# Patient Record
Sex: Male | Born: 1995 | Hispanic: Yes | Marital: Single | Smoking: Never smoker
Health system: Southern US, Community
[De-identification: ages and names within clinical notes are randomized; demographics above are authoritative.]

## PROBLEM LIST (undated history)

## (undated) DIAGNOSIS — G8929 Other chronic pain: Secondary | ICD-10-CM

## (undated) DIAGNOSIS — K5190 Ulcerative colitis, unspecified, without complications: Secondary | ICD-10-CM

## (undated) HISTORY — DX: Other chronic pain: G89.29

## (undated) HISTORY — DX: Ulcerative colitis, unspecified, without complications: K51.90

---

## 2021-01-10 ENCOUNTER — Emergency Department (HOSPITAL_COMMUNITY)
Admission: EM | Admit: 2021-01-10 | Discharge: 2021-01-11 | Disposition: A | Discharge status: Home / Self Care | Payer: 59 | Attending: Emergency Medicine | Admitting: Emergency Medicine

## 2021-01-10 ENCOUNTER — Encounter (HOSPITAL_COMMUNITY): Payer: Self-pay

## 2021-01-10 ENCOUNTER — Other Ambulatory Visit: Payer: Self-pay

## 2021-01-10 DIAGNOSIS — S76012A Strain of muscle, fascia and tendon of left hip, initial encounter: Secondary | ICD-10-CM | POA: Diagnosis not present

## 2021-01-10 DIAGNOSIS — R000 Tachycardia, unspecified: Secondary | ICD-10-CM | POA: Diagnosis not present

## 2021-01-10 DIAGNOSIS — K922 Gastrointestinal hemorrhage, unspecified: Secondary | ICD-10-CM

## 2021-01-10 DIAGNOSIS — R0789 Other chest pain: Secondary | ICD-10-CM | POA: Diagnosis not present

## 2021-01-10 DIAGNOSIS — S79912A Unspecified injury of left hip, initial encounter: Secondary | ICD-10-CM | POA: Diagnosis present

## 2021-01-10 DIAGNOSIS — M549 Dorsalgia, unspecified: Secondary | ICD-10-CM | POA: Diagnosis not present

## 2021-01-10 DIAGNOSIS — X58XXXA Exposure to other specified factors, initial encounter: Secondary | ICD-10-CM | POA: Diagnosis not present

## 2021-01-10 DIAGNOSIS — S76019A Strain of muscle, fascia and tendon of unspecified hip, initial encounter: Secondary | ICD-10-CM

## 2021-01-10 DIAGNOSIS — Z20822 Contact with and (suspected) exposure to covid-19: Secondary | ICD-10-CM | POA: Diagnosis not present

## 2021-01-10 LAB — COMPREHENSIVE METABOLIC PANEL
ALT: 25 U/L (ref 0–44)
AST: 17 U/L (ref 15–41)
Albumin: 3.2 g/dL — ABNORMAL LOW (ref 3.5–5.0)
Alkaline Phosphatase: 34 U/L — ABNORMAL LOW (ref 38–126)
Anion gap: 8 (ref 5–15)
BUN: 16 mg/dL (ref 6–20)
CO2: 26 mmol/L (ref 22–32)
Calcium: 8.7 mg/dL — ABNORMAL LOW (ref 8.9–10.3)
Chloride: 101 mmol/L (ref 98–111)
Creatinine, Ser: 1.06 mg/dL (ref 0.61–1.24)
GFR, Estimated: >60 mL/min (ref >60)
Glucose, Bld: 118 mg/dL — ABNORMAL HIGH (ref 70–99)
Potassium: 3.5 mmol/L (ref 3.5–5.1)
Sodium: 135 mmol/L (ref 135–145)
Total Bilirubin: 0.2 mg/dL — ABNORMAL LOW (ref 0.3–1.2)
Total Protein: 7.1 g/dL (ref 6.5–8.1)

## 2021-01-10 LAB — TYPE AND SCREEN
ABO/RH(D): AB POS
Antibody Screen: NEGATIVE

## 2021-01-10 LAB — CBC
HCT: 34.7 % — ABNORMAL LOW (ref 39.0–52.0)
Hemoglobin: 11.4 g/dL — ABNORMAL LOW (ref 13.0–17.0)
MCH: 27.4 pg (ref 26.0–34.0)
MCHC: 32.9 g/dL (ref 30.0–36.0)
MCV: 83.4 fL (ref 80.0–100.0)
Platelets: 324 10*3/uL (ref 150–400)
RBC: 4.16 MIL/uL — ABNORMAL LOW (ref 4.22–5.81)
RDW: 12.2 % (ref 11.5–15.5)
WBC: 12.3 10*3/uL — ABNORMAL HIGH (ref 4.0–10.5)
nRBC: 0 % (ref 0.0–0.2)

## 2021-01-10 NOTE — ED Provider Notes (Signed)
Emergency Medicine Provider Triage Evaluation Note  Johnny Duffy , a 25 y.o. male  was evaluated in triage.  Pt complains of back pain and bloody stools. Denies constipation.  Review of Systems  Positive: Back pain, bloody stools, abd pain Negative: constipation  Physical Exam  BP 138/89 (BP Location: Right Arm)   Pulse (!) 113   Temp 98.1 F (36.7 C) (Oral)   Resp 18   Ht 5\' 4"  (1.626 m)   Wt 63.5 kg   SpO2 99%   BMI 24.03 kg/m  Gen:   Awake, no distress   Resp:  Normal effort  MSK:   Moves extremities without difficulty   Medical Decision Making  Medically screening exam initiated at 1:39 PM.  Appropriate orders placed.  Johnny Duffy was informed that the remainder of the evaluation will be completed by another provider, this initial triage assessment does not replace that evaluation, and the importance of remaining in the ED until their evaluation is complete.     Mayra Neer 01/10/21 1340    01/12/21, MD 01/10/21 1534

## 2021-01-10 NOTE — ED Triage Notes (Signed)
Patient c/o left lower back pain x 2 weeks and the pain radiates into the left leg. Patient went to UC on 10/20 and received hydrocodone and a muscle relaxer, but states that it did not help. Patient states it just made him feel tired so he did not take. Patient states he did receive a shot of Toradol and that seemed to help more  Patient also reports intermittent rectal bleeding that varies between dark red and bright red blood. Patient states he has abdominal discomfort, but not pain.

## 2021-01-11 ENCOUNTER — Encounter (HOSPITAL_COMMUNITY): Payer: Self-pay

## 2021-01-11 ENCOUNTER — Emergency Department (HOSPITAL_COMMUNITY): Payer: 59

## 2021-01-11 LAB — RESP PANEL BY RT-PCR (FLU A&B, COVID) ARPGX2
Influenza A by PCR: NEGATIVE
Influenza B by PCR: NEGATIVE
SARS Coronavirus 2 by RT PCR: NEGATIVE

## 2021-01-11 MED ORDER — IOHEXOL 350 MG/ML SOLN
80.0000 mL | Freq: Once | INTRAVENOUS | Status: AC | PRN
  Administered 2021-01-11: 80 mL via INTRAVENOUS

## 2021-01-11 MED ORDER — ACETAMINOPHEN 325 MG PO TABS
650.0000 mg | ORAL_TABLET | Freq: Once | ORAL | Status: AC
  Administered 2021-01-11: 650 mg via ORAL
  Filled 2021-01-11: qty 2

## 2021-01-11 MED ORDER — AMOXICILLIN-POT CLAVULANATE 875-125 MG PO TABS
1.0000 | ORAL_TABLET | Freq: Two times a day (BID) | ORAL | 0 refills | Status: DC
Start: 2021-01-11 — End: 2021-01-11

## 2021-01-11 MED ORDER — AMOXICILLIN-POT CLAVULANATE 875-125 MG PO TABS: 1.0000 | ORAL_TABLET | Freq: Two times a day (BID) | ORAL | 0 refills | Status: AC

## 2021-01-11 MED ORDER — SODIUM CHLORIDE 0.9 % IV BOLUS
1000.0000 mL | Freq: Once | INTRAVENOUS | Status: AC
  Administered 2021-01-11: 1000 mL via INTRAVENOUS

## 2021-01-11 MED ORDER — AMOXICILLIN-POT CLAVULANATE 875-125 MG PO TABS
1.0000 | ORAL_TABLET | Freq: Once | ORAL | Status: AC
  Administered 2021-01-11: 1 via ORAL
  Filled 2021-01-11: qty 1

## 2021-01-11 NOTE — ED Notes (Signed)
Pt transported to CT ?

## 2021-01-11 NOTE — ED Notes (Signed)
Pt aware urine sample needed 

## 2021-01-11 NOTE — Discharge Instructions (Addendum)
You are seen in the ER for bloody stools.  Given that your symptoms of loose bowel movement have been present for a while, it is unclear what to make of the transition to bloody stools.  Fortunately, your blood work here including your hemoglobin is overall reassuring  Our recommendation is that you take the antibiotics only if you start developing fevers or abdominal pain.  Otherwise, it is prudent that you call the GI doctor for a close follow-up.  Return to the ER if you start having worsening bloody stools, weakness, dark tarry stools, bloody vomit.

## 2021-01-11 NOTE — ED Provider Notes (Signed)
[facility]-EMERGENCY DEPT Provider Note   CSN: [id number] Arrival date & time: 01/10/21  1247     History Chief Complaint  Patient presents with   Back Pain   GI Bleeding    Johnny Duffy is a 25 y.o. male.  HPI    25 year old comes in with chief complaint of back pain, bloody stools and chest pain. Patient has no significant medical history.  He reports that he started having loose bowel movements about a month and a half ago.  Normally he has 3 BMs a day.  In the last month he has been having about 4 BMs a day and they have been loose.  Today he had 2 loose bowel movements that were bloody.  He denies any associated abdominal pain.  Patient also denies any fevers, chills, nausea, vomiting.  Additionally patient reports that he is having some back pain.  The back pain is located in the left hip region.  He was seen at urgent care and was put on hydrocodone with a diagnosis of sciatica, but the pain medication has not helped.  He denies radiation of the pain to his foot and also denies any numbness or tingling.  Pain is worse with activities and when he turns onto that side.  Finally patient also reports having some chest pain.  Chest pain is worse with deep inspiration.  He had flown to [place] after driving to [place] in September.  History reviewed. No pertinent past medical history.  There are no problems to display for this patient.   History reviewed. No pertinent surgical history.     Family History  Problem Relation Age of Onset   Diabetes Father     Social History   Tobacco Use   Smoking status: Never   Smokeless tobacco: Never  Vaping Use   Vaping Use: Never used  Substance Use Topics   Alcohol use: Never   Drug use: Never    Home Medications Prior to Admission medications   Medication Sig Start Date End Date Taking? Authorizing Provider  cyclobenzaprine (FLEXERIL) 10 MG tablet Take 10 mg by mouth 3 (three) times daily as  needed for muscle spasms. 01/02/21  Yes [provider]  HYDROcodone-acetaminophen (NORCO/VICODIN) 5-325 MG tablet Take 1 tablet by mouth 4 (four) times daily as needed for pain. 01/07/21  Yes [provider]  Ibuprofen-Acetaminophen (ADVIL DUAL ACTION) 125-250 MG TABS Take 2 tablets by mouth every 8 (eight) hours as needed (pain).   Yes [provider]  amoxicillin-clavulanate (AUGMENTIN) 875-125 MG tablet Take 1 tablet by mouth every 12 (twelve) hours. 01/11/21   Derwood Kaplan, MD    Allergies    Patient has no known allergies.  Review of Systems   Review of Systems  Constitutional:  Positive for activity change.  Cardiovascular:  Positive for chest pain.  Gastrointestinal:  Positive for blood in stool.  All other systems reviewed and are negative.  Physical Exam Updated Vital Signs BP 138/85   Pulse 97   Temp 99.3 F (37.4 C) (Oral)   Resp (!) 22   Ht 5\' 4"  (1.626 m)   Wt 63.5 kg   SpO2 99%   BMI 24.03 kg/m   Physical Exam Vitals and nursing note reviewed.  Constitutional:      Appearance: He is well-developed.  HENT:     Head: Atraumatic.  Cardiovascular:     Rate and Rhythm: Tachycardia present.  Pulmonary:     Effort: Pulmonary effort is normal.  Abdominal:     Tenderness: There is no abdominal tenderness.  Musculoskeletal:     Cervical back: Neck supple.  Skin:    General: Skin is warm.  Neurological:     Mental Status: He is alert and oriented to person, place, and time.    ED Results / Procedures / Treatments   Labs (all labs ordered are listed, but only abnormal results are displayed) Labs Reviewed  COMPREHENSIVE METABOLIC PANEL - Abnormal; Notable for the following components:      Result Value   Glucose, Bld 118 (*)    Calcium 8.7 (*)    Albumin 3.2 (*)    Alkaline Phosphatase 34 (*)    Total Bilirubin 0.2 (*)    All other components within normal limits  CBC - Abnormal; Notable for the following components:   WBC  12.3 (*)    RBC 4.16 (*)    Hemoglobin 11.4 (*)    HCT 34.7 (*)    All other components within normal limits  RESP PANEL BY RT-PCR (FLU A&B, COVID) ARPGX2  URINALYSIS, ROUTINE W REFLEX MICROSCOPIC  POC OCCULT BLOOD, ED  TYPE AND SCREEN    EKG None  Radiology CT Angio Chest PE W and/or Wo Contrast  Result Date: 01/11/2021 CLINICAL DATA:  Pleuritic chest pain. Recent long duration flight. High suspicion for PE. EXAM: CT ANGIOGRAPHY CHEST WITH CONTRAST TECHNIQUE: Multidetector CT imaging of the chest was performed using the standard protocol during bolus administration of intravenous contrast. Multiplanar CT image reconstructions and MIPs were obtained to evaluate the vascular anatomy. CONTRAST:  1mL OMNIPAQUE IOHEXOL 350 MG/ML SOLN COMPARISON:  None. FINDINGS: Cardiovascular: Satisfactory opacification of the pulmonary arteries to the segmental level. No evidence of pulmonary embolism. Normal heart size. No pericardial effusion. Mediastinum/Nodes: No enlarged mediastinal, hilar, or axillary lymph nodes. Thyroid gland, trachea, and esophagus demonstrate no significant findings. Lungs/Pleura: Lungs are clear. No pleural effusion or pneumothorax. Upper Abdomen: No acute abnormality. Musculoskeletal: No chest wall abnormality. No acute or significant osseous findings. Review of the MIP images confirms the above findings. IMPRESSION: Negative for acute pulmonary embolus, pneumonia or other acute cardiopulmonary process. Electronically Signed   By: Malachy Moan M.D.   On: 01/11/2021 14:20    Procedures Procedures   Medications Ordered in ED Medications  acetaminophen (TYLENOL) tablet 650 mg (650 mg Oral Given 01/11/21 0303)  amoxicillin-clavulanate (AUGMENTIN) 875-125 MG per tablet 1 tablet (1 tablet Oral Given 01/11/21 1314)  sodium chloride 0.9 % bolus 1,000 mL (1,000 mLs Intravenous New Bag/Given (Non-Interop) 01/11/21 1340)  iohexol (OMNIPAQUE) 350 MG/ML injection 80 mL (80 mLs  Intravenous Contrast Given 01/11/21 1405)    ED Course  I have reviewed the triage vital signs and the nursing notes.  Pertinent labs & imaging results that were available during my care of the patient were reviewed by me and considered in my medical decision making (see chart for details).    MDM Rules/Calculators/A&P                           25 year old male comes in with multiple complaints.  Primarily he is here for bloody stools.  He has been having loose BM for the last several days, yesterday he had 2 episodes of bloody stools.  While in the triage he had low-grade fever.  No abdominal pain.  Recently he had traveled to [place], but states that the bloody stools actually started even prior to that trip.  He  reports about 10 pound weight loss in the last month and a half.  No family history of IBD, but that is in the consideration for him.  We will advised that he gets GI follow-up.  Given that he had low-grade fever, we have given him antibiotics as a wait and watch approach.  Additionally, patient is having some back pain.  Clinically does not appear to be sciatica.  There is no passive leg raise.  We will give him orthopedics follow-up as he has not improved despite the treatment over the past several days.  Finally, patient has pleuritic chest pain with recent long distance travels.  We did not order D-dimer on him because of the active bleeding, CT PE was ordered and it is negative for acute findings.  Stable for discharge at this time.   Final Clinical Impression(s) / ED Diagnoses Final diagnoses:  Lower GI bleed  Hip strain, unspecified laterality, initial encounter    Rx / DC Orders ED Discharge Orders          Ordered    amoxicillin-clavulanate (AUGMENTIN) 875-125 MG tablet  Every 12 hours,   Status:  Discontinued        01/11/21 1305    amoxicillin-clavulanate (AUGMENTIN) 875-125 MG tablet  Every 12 hours        01/11/21 1305             Derwood Kaplan,  MD 01/11/21 [phone]

## 2021-01-16 ENCOUNTER — Other Ambulatory Visit: Payer: Self-pay | Admitting: Gastroenterology

## 2021-01-16 DIAGNOSIS — K921 Melena: Secondary | ICD-10-CM

## 2021-01-16 DIAGNOSIS — R634 Abnormal weight loss: Secondary | ICD-10-CM

## 2021-01-16 DIAGNOSIS — R197 Diarrhea, unspecified: Secondary | ICD-10-CM

## 2021-01-16 DIAGNOSIS — R109 Unspecified abdominal pain: Secondary | ICD-10-CM

## 2021-02-03 ENCOUNTER — Other Ambulatory Visit: Payer: 59

## 2023-03-05 ENCOUNTER — Telehealth: Payer: Self-pay | Admitting: Internal Medicine

## 2023-03-05 NOTE — Telephone Encounter (Signed)
Good afternoon Dr. Leonides Schanz,   Supervising Provider PM 12/20   We received a call from this patient wishing to schedule an appointment to establish GI care. Patient has GI history with Dr. Marca Ancona at [place] and was last seen in 12/2021. He states he was dismissed, and would like to establish with a different GI. Records from previous visits were obtained and scanned into Media for review. Would you please advise on scheduling?  Thank you.

## 2023-03-05 NOTE — Telephone Encounter (Signed)
Okay to schedule for OV.  History of UC. Initially on Apriso but had to stop due to skin reaction. Was intermittently on prednisone and then started Humira 11/2021.   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   Colonoscopy 01/27/21: Good prep. Mild inflammation in the terminal ileum. Diffusely severe inflammation in the entire colon except in the rectum. Rectum had patchy severe inflammation. 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 Colon biopsies showed inflammation.

## 2023-03-15 ENCOUNTER — Telehealth: Payer: Self-pay | Admitting: Gastroenterology

## 2023-03-15 ENCOUNTER — Other Ambulatory Visit (INDEPENDENT_AMBULATORY_CARE_PROVIDER_SITE_OTHER): Payer: 59

## 2023-03-15 ENCOUNTER — Ambulatory Visit (INDEPENDENT_AMBULATORY_CARE_PROVIDER_SITE_OTHER): Payer: 59 | Admitting: Gastroenterology

## 2023-03-15 VITALS — BP 110/70 | HR 89 | Ht 64.0 in | Wt 150.0 lb

## 2023-03-15 DIAGNOSIS — K5100 Ulcerative (chronic) pancolitis without complications: Secondary | ICD-10-CM | POA: Diagnosis not present

## 2023-03-15 LAB — COMPREHENSIVE METABOLIC PANEL
ALT: 27 U/L (ref 0–53)
AST: 35 U/L (ref 0–37)
Albumin: 4.7 g/dL (ref 3.5–5.2)
Alkaline Phosphatase: 32 U/L — ABNORMAL LOW (ref 39–117)
BUN: 24 mg/dL — ABNORMAL HIGH (ref 6–23)
CO2: 26 meq/L (ref 19–32)
Calcium: 9.1 mg/dL (ref 8.4–10.5)
Chloride: 105 meq/L (ref 96–112)
Creatinine, Ser: 0.92 mg/dL (ref 0.40–1.50)
GFR: 114.37 mL/min (ref >60.00)
Glucose, Bld: 87 mg/dL (ref 70–99)
Potassium: 3.9 meq/L (ref 3.5–5.1)
Sodium: 139 meq/L (ref 135–145)
Total Bilirubin: 0.6 mg/dL (ref 0.2–1.2)
Total Protein: 7.3 g/dL (ref 6.0–8.3)

## 2023-03-15 LAB — CBC WITH DIFFERENTIAL/PLATELET
Basophils Absolute: 0.1 10*3/uL (ref 0.0–0.1)
Basophils Relative: 0.8 % (ref 0.0–3.0)
Eosinophils Absolute: 0.2 10*3/uL (ref 0.0–0.7)
Eosinophils Relative: 2.8 % (ref 0.0–5.0)
HCT: 42.9 % (ref 39.0–52.0)
Hemoglobin: 14.5 g/dL (ref 13.0–17.0)
Lymphocytes Relative: 28.3 % (ref 12.0–46.0)
Lymphs Abs: 1.7 10*3/uL (ref 0.7–4.0)
MCHC: 33.7 g/dL (ref 30.0–36.0)
MCV: 87.1 fL (ref 78.0–100.0)
Monocytes Absolute: 0.3 10*3/uL (ref 0.1–1.0)
Monocytes Relative: 5.2 % (ref 3.0–12.0)
Neutro Abs: 3.9 10*3/uL (ref 1.4–7.7)
Neutrophils Relative %: 62.9 % (ref 43.0–77.0)
Platelets: 181 10*3/uL (ref 150.0–400.0)
RBC: 4.93 Mil/uL (ref 4.22–5.81)
RDW: 12.7 % (ref 11.5–15.5)
WBC: 6.1 10*3/uL (ref 4.0–10.5)

## 2023-03-15 LAB — HIGH SENSITIVITY CRP: CRP, High Sensitivity: 2.19 mg/L (ref 0.000–5.000)

## 2023-03-15 MED ORDER — ADALIMUMAB 40 MG/0.4ML ~~LOC~~ AJKT: 40.0000 mg | AUTO-INJECTOR | SUBCUTANEOUS | 6 refills | Status: DC

## 2023-03-15 NOTE — Patient Instructions (Addendum)
Your provider has requested that you go to the basement level for lab work before leaving today. Press "B" on the elevator. The lab is located at the first door on the left as you exit the elevator.  Please give [place] a call to get scheduled for a 6 month follow up with Dr Leonides Schanz  _______________________________________________________  If your blood pressure at your visit was 140/90 or greater, please contact your primary care physician to follow up on this.  _______________________________________________________  If you are age 43 or older, your body mass index should be between 23-30. Your Body mass index is 25.75 kg/m. If this is out of the aforementioned range listed, please consider follow up with your Primary Care Provider.  If you are age 51 or younger, your body mass index should be between 19-25. Your Body mass index is 25.75 kg/m. If this is out of the aformentioned range listed, please consider follow up with your Primary Care Provider.   ________________________________________________________  The [facility] providers would like to encourage you to use [facility] to communicate with providers for non-urgent requests or questions.  Due to long hold times on the telephone, sending your provider a message by [facility] may be a faster and more efficient way to get a response.  Please allow 48 business hours for a response.  Please remember that this is for non-urgent requests.  _______________________________________________________ It was a pleasure to see you today!  Thank you for trusting me with your gastrointestinal care!

## 2023-03-15 NOTE — Telephone Encounter (Signed)
Patient called and stated that his Humira is needing a Prior Authorization and that it is needing to be sent over to the speciality Pharmacy. Speciality Pharmacy number is [phone].Please advise.

## 2023-03-15 NOTE — Telephone Encounter (Signed)
PA Team-  Can we try to get approval for this patient's Humira 40 mg every 2 weeks?   Thank you!!   Contacted [facility] to find out which specialty pharmacy Humira rx should go to. Rx to go to [address], [place] fax [phone]. Rx sent electronically.

## 2023-03-15 NOTE — Progress Notes (Signed)
Chief Complaint: Ulcerative pancolitis Primary GI MD: Dr. Leonides Schanz  HPI: 27 year old male history of ulcerative colitis presents for transfer of care. Previously patient of Dr. Marca Ancona at [facility] last seen by their practice October 2023.  IBD History: -History of ulcerative colitis diagnosed after colonoscopy November 2022 which was done for rectal bleeding, abdominal pain, elevated fecal calprotectin.   -Initially on Apriso but had to stop secondary to skin reaction.   -Was intermittently on prednisone but started Humira September 2023. 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  Colon biopsy showed inflammation.  01/05/22 labs show: WBC 4.1, hemoglobin 12.8, CRP less than 3, sed rate less than 1, normal kidney function, normal LFT's.  Interval History:    Patient presents to establish care for his Ulcerative pancolitis and is currently taking Humira every other week. Patients next dose due on 03/16/2022.  Patient reports his normal bowel habits consist of 3-4 bowel movements per day, semi formed. No abdominal pain or rectal bleeding. Ne nausea, vomiting, or weight loss. Good appetite.He does have red patches/rash on bilateral lower extremities/ankles and left wrist where his watch sits. His PCP prescribed a antifungal cream which he states has helped. No visional changes or joint pain. He reports prior to starting Humira he had back pain that since then has resolved. Family history- no hx of CA, cousin with Crohn's disease. Socially drinks. No tobacco use. He currently works as a Associate Professor at [employer].  Wt Readings from Last 3 Encounters:  03/15/23 150 lb (68 kg)  01/10/21 140 lb  (63.5 kg)    Past Medical History:  Diagnosis Date   Chronic pain    Ulcerative colitis (HCC)    History reviewed. No pertinent surgical history.  Current Outpatient Medications  Medication Sig Dispense Refill   adalimumab (HUMIRA) 40 MG/0.4ML pen Inject into the skin.     Iron Combinations (CHROMAGEN) capsule Take 1 capsule by mouth daily.     amoxicillin-clavulanate (AUGMENTIN) 875-125 MG tablet Take 1 tablet by mouth every 12 (twelve) hours. (Patient not taking: Reported on 03/15/2023) 14 tablet 0   cyclobenzaprine (FLEXERIL) 10 MG tablet Take 10 mg by mouth 3 (three) times daily as needed for muscle spasms. (Patient not taking: Reported on 03/15/2023)     HYDROcodone-acetaminophen (NORCO/VICODIN) 5-325 MG tablet Take 1 tablet by mouth 4 (four) times daily as needed for pain. (Patient not taking: Reported on 03/15/2023)     Ibuprofen-Acetaminophen (ADVIL DUAL ACTION) 125-250 MG TABS Take 2 tablets by mouth every 8 (eight) hours as needed (pain). (Patient not taking: Reported on 03/15/2023)     No current facility-administered medications for this visit.   Allergies as of 03/15/2023   (No Known Allergies)   Family History  Problem Relation Age of Onset   Diabetes Father    Social History   Socioeconomic History   Marital status: Single    Spouse name: Not on file   Number of children: 0   Years of education: Not on file   Highest education level: Not on file  Occupational History   Occupation: pharmacy  Tobacco Use   Smoking status: Never   Smokeless tobacco: Never  Vaping Use   Vaping status: Never Used  Substance and Sexual Activity   Alcohol use: Yes    Comment: not often   Drug use: Never   Sexual activity: Not on file  Other Topics Concern   Not on file  Social History Narrative   Not on file   Social Drivers of Health   Financial Resource Strain: High Risk (10/19/2021)   Received from [facility], [facility]   Overall Financial Resource Strain  (CARDIA)    Difficulty of Paying Living Expenses: Hard  Food Insecurity: Patient Declined (08/14/2021)   Received from [facility], [facility]   Hunger Vital Sign    Worried About Running Out of Food in the Last Year: Patient declined    Ran Out of Food in the Last Year: Patient declined  Transportation Needs: No Transportation Needs (10/25/2021)   Received from [facility], [facility]   PRAPARE - Transportation    Lack of Transportation (Medical): No    Lack of Transportation (Non-Medical): No  Physical Activity: Inactive (08/14/2021)   Received from [facility], [facility]   Exercise Vital Sign    Days of Exercise per Week: 0 days    Minutes of Exercise per Session: 10 min  Stress: Not on file (01/19/2023)  Social Connections: Unknown (08/23/2022)   Received from [facility]   Social Network    Social Network: Not on file  Intimate Partner Violence: Unknown (08/23/2022)   Received from [facility]   HITS    Physically Hurt: Not on file    Insult or Talk Down To: Not on file    Threaten Physical Harm: Not on file    Scream or Curse: Not on file   Review of Systems:    Constitutional: No weight loss, fever, chills, weakness or fatigue HEENT: Eyes: No change in vision               Ears, Nose, Throat:  No change in hearing or congestion Skin: No rash or itching Cardiovascular: No chest pain, chest pressure or palpitations   Respiratory: No SOB or cough Gastrointestinal: See HPI and otherwise negative Genitourinary: No dysuria or change in urinary frequency Neurological: No headache, dizziness or syncope Musculoskeletal: No new muscle or joint pain Hematologic: No bleeding or bruising Psychiatric: No history of depression or anxiety   Physical Exam:  Vital signs: BP 110/70   Pulse 89   Ht 5\' 4"  (1.626 m)   Wt 150 lb (68 kg)   BMI 25.75 kg/m   Constitutional: NAD, Well developed, Well nourished, alert and cooperative Head:  Normocephalic and  atraumatic. Eyes:   PEERL, EOMI. No icterus. Conjunctiva pink. Respiratory: Respirations even and unlabored. Lungs clear to auscultation bilaterally.   No wheezes, crackles, or rhonchi.  Cardiovascular:  Regular rate and rhythm. No peripheral edema, cyanosis or pallor.  Gastrointestinal:  Soft, nondistended, nontender. No rebound or guarding. Normal bowel sounds. No appreciable masses or hepatomegaly. Rectal:  Not performed.  Msk:  Symmetrical without gross deformities. Without edema, no deformity or joint abnormality.  Neurologic:  Alert and  oriented x4;  grossly normal neurologically.  Skin:   Dry and intact with small red patchy red rashes on lower extremities and left wrist. Psychiatric: Oriented to person, place and time. Demonstrates good judgement and reason without abnormal affect or behaviors.  RELEVANT LABS AND IMAGING:  CBC    Latest Ref Rng & Units 01/10/2021    2:02 PM  CBC  WBC 4.0 - 10.5 K/uL 12.3   Hemoglobin 13.0 - 17.0  g/dL 60.6   Hematocrit 30.1 - 52.0 % 34.7   Platelets 150 - 400 K/uL 324     CMP     Latest Ref Rng & Units 01/10/2021    2:02 PM  CMP  Glucose 70 - 99 mg/dL 601   BUN 6 - 20 mg/dL 16   Creatinine 0.93 - 1.24 mg/dL 2.35   Sodium 573 - 220 mmol/L 135   Potassium 3.5 - 5.1 mmol/L 3.5   Chloride 98 - 111 mmol/L 101   CO2 22 - 32 mmol/L 26   Calcium 8.9 - 10.3 mg/dL 8.7   Total Protein 6.5 - 8.1 g/dL 7.1   Total Bilirubin 0.3 - 1.2 mg/dL 0.2   Alkaline Phos 38 - 126 U/L 34   AST 15 - 41 U/L 17   ALT 0 - 44 U/L 25      Assessment/Plan:   Encounter Diagnosis  Name Primary?   Ulcerative pancolitis (HCC) Yes    Johnny Duffy is a 27 year old patient with histort of Ulcerative pancolitis that has remained asymptomatic on Humira biweekly. Patient due for annual lab work. Will check inflammatory markers.   -Refill Humira [facility] specialty pharmacy ) -Check CRP, CBC, CMET, TB Quant -Check Fecal calprotectin -Educated on yearly dermatology and  ophthalmology appointment -Follow-up with Dr. Leonides Schanz in 6 mths  Chryl Holten Jolee Ewing [facility] 03/15/2023, 2:16 PM  Cc: No ref. provider found

## 2023-03-16 ENCOUNTER — Telehealth: Payer: Self-pay | Admitting: Pharmacy Technician

## 2023-03-16 NOTE — Telephone Encounter (Signed)
 PA request has been Submitted. New Encounter created for follow up. For additional info see Pharmacy Prior Auth telephone encounter from 03/16/23.

## 2023-03-16 NOTE — Telephone Encounter (Signed)
Pharmacy Patient Advocate Encounter  Received notification from [facility] that Prior Authorization for Humira Pen has been APPROVED from 03/16/23 to 03/15/24   PA #/Case ID/Reference #: Key: [id number]

## 2023-03-16 NOTE — Telephone Encounter (Signed)
 Pharmacy Patient Advocate Encounter   Received notification from Physician's Office that prior authorization for Humira  Pen is required/requested.   Insurance verification completed.   The patient is insured through [facility] .   Per test claim: PA required; PA submitted to above mentioned insurance via CoverMyMeds Key/confirmation #/EOC [id number] Status is pending

## 2023-03-18 LAB — QUANTIFERON-TB GOLD PLUS
Mitogen-NIL: 8 [IU]/mL
NIL: 0.03 [IU]/mL
QuantiFERON-TB Gold Plus: NEGATIVE
TB1-NIL: <0 [IU]/mL
TB2-NIL: <0 [IU]/mL

## 2023-04-22 ENCOUNTER — Telehealth: Payer: Self-pay | Admitting: Gastroenterology

## 2023-04-22 NOTE — Telephone Encounter (Signed)
 PT's returning call. Please advise.

## 2023-04-22 NOTE — Telephone Encounter (Signed)
 Patient called and stated that he is need a generic from of Humira due to his insurance not wanting to cover that medication. Patient also stated that he is needing a refill for humira. Patient is requesting a call back. Please advise.

## 2023-04-23 NOTE — Telephone Encounter (Signed)
 PA team-  Patient states that although we have approval on file for Humira  every 2 weeks dosing thru 03/15/24, he is told by his pharmacy [facility]) that insurance says they will no longer cover Humira . They will cover Amjevita  instead. Patient states that nothing has changed on his insurance that he is aware. Pharmacy states we will have to contact patients plan about this.  Can you all help navigate which medication insurance will cover at this point? Patient is due for his next injection this upcoming week and we would definitely prefer not to have treatment delayed if at all possible.  Thanks for any help!

## 2023-04-29 ENCOUNTER — Other Ambulatory Visit (HOSPITAL_COMMUNITY): Payer: Self-pay

## 2023-04-30 ENCOUNTER — Other Ambulatory Visit (HOSPITAL_COMMUNITY): Payer: Self-pay

## 2023-04-30 NOTE — Telephone Encounter (Signed)
I have spoken to patient to explain that pharmacy should do override for Humira following previous prior authorization approval. Patient verbalizes understanding and will re-request Humira.

## 2023-05-04 NOTE — Telephone Encounter (Signed)
[facility] rep called to advise the Humira still needs a new PA for this year.

## 2023-05-04 NOTE — Telephone Encounter (Signed)
I have spoken to [facility], pharmacist at [facility] (phone [phone]) who indicates that they spoke to patient's insurance carrier today and we must fill out an additional prior authorization through [email or web address]. They state that the approval we have on file was from when Humira was on their formulary. Since it no longer is, a new approval is needed.   Cover my meds Key: [id number]   Request has been sent to [facility] through [email or web address].

## 2023-05-05 MED ORDER — AMJEVITA 40 MG/0.4ML ~~LOC~~ SOAJ
1.0000 | SUBCUTANEOUS | 5 refills | Status: AC
Start: 2023-05-05 — End: ?

## 2023-05-05 NOTE — Telephone Encounter (Signed)
I have spoken to Johnny Duffy., pharmacist at [facility] to ask for expedited shipment of Amjevita since we have just discovered that insurance requires this as a replacement for Humira. Advised that when trying to get PA for Amjevita, I am again given a note that PA is cancelled since there is an approval on file from 03/16/23-03/15/24 ([id number]). After further research, Johnny Duffy found that insurance will cover Amjevita but must be run specifically under Johnny Duffy [id number] in order to get approval. She ran prescription and was given a patient price of $1000 plus dollars. She states that she thinks this is due to deductible.  I have spoken to patient to advise of this information and have also set him up for patient copay card through Johnny Duffy: [id number]  PCN: [id number]  GROUP: [id number] MEMBER ID: [id number]  I have spoken with Johnny Duffy at [facility] and provided her with this copay assistance information which she says they will apply to rx in process. She indicates that they will expedite rx since patient is currently without medication. Patient advised as well and asked to contact us with any further issues.

## 2023-05-05 NOTE — Telephone Encounter (Signed)
Attempted to complete PA on [email or web address]. This request was cancelled since there was already approval through 03/15/24. However, following conversation with [facility] help line [phone]), I am told patient must now try amjevita in place of Humira.   Johnny Duffy, may I send for Amjevita 40 mg/0.4 ml every 14 days in place of Humira?

## 2023-05-05 NOTE — Addendum Note (Signed)
Addended by: Richardson Chiquito on: 05/05/2023 11:17 AM   Modules accepted: Orders

## 2023-05-07 NOTE — Telephone Encounter (Signed)
Contacted patient to make certain that [facility] has reached out to him for Amjevita shipment. He states he has not heard anything.  I then called [facility] and spoke to both [place] and [place], her supervisor who tell me that rx is not going through because it is "not on formulary". I explained my previous conversation with Toy, pharmacist  at [facility] who says that rx will only go through with a specific NDC number. I was then transferred to [facility] with insurance plan. The NDC number they have been running and the [facility] number I have provided is 1 number off. Marcelino Duster states she will have her pharmacy team work on this STAT and will have a representative call me back today to advise.

## 2023-05-10 NOTE — Telephone Encounter (Signed)
 I never got a call back from insurance as promised, so I contacted [facility] today. I am told by Britta Mccreedy that patients Amjevita is now ready to be scheduled for delivery and copay card brought the cost down to $0.  Patient states that he has requested the delivery.

## 2023-08-05 ENCOUNTER — Other Ambulatory Visit: Payer: Self-pay | Admitting: Gastroenterology

## 2023-09-23 IMAGING — CT CT ANGIO CHEST
2 of 6 series · 18 of 36 positions shown · IV contrast (OMNIPAQUE 350)
Comparison: None.

CLINICAL DATA: Pleuritic chest pain. Recent long duration flight.
High suspicion for PE.

EXAM:
CT ANGIOGRAPHY CHEST WITH CONTRAST
TECHNIQUE: Multidetector CT imaging of the chest was performed using the
standard protocol during bolus administration of intravenous
contrast. Multiplanar CT image reconstructions and MIPs were
obtained to evaluate the vascular anatomy.
CONTRAST:  80mL OMNIPAQUE IOHEXOL 350 MG/ML SOLN

[Series 6: thins · axial · 0.62mm/px · z∈[+1292,+1530]mm · 17 of 267 slices shown]
[im 15/267  lung]
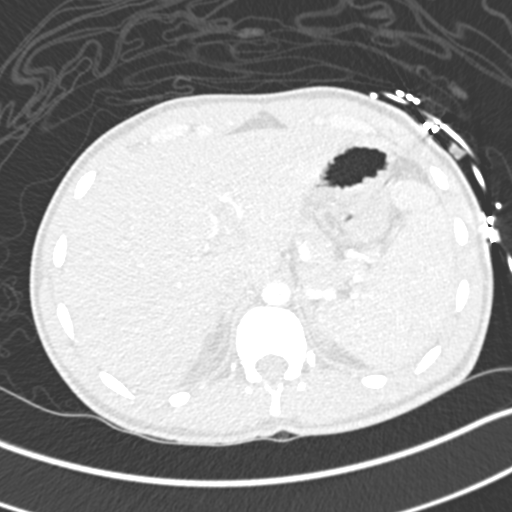
[im 30/267  mediastinal]
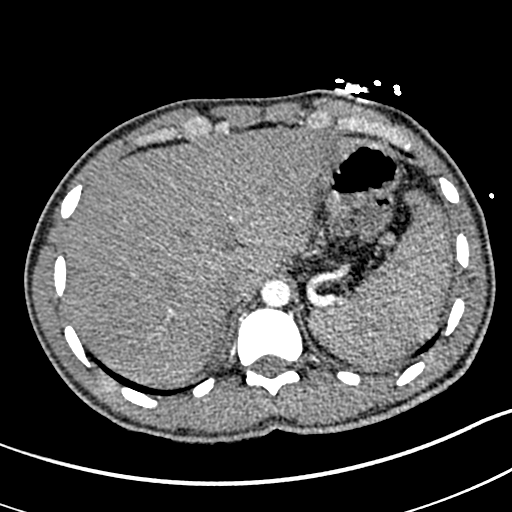
[im 45/267  lung]
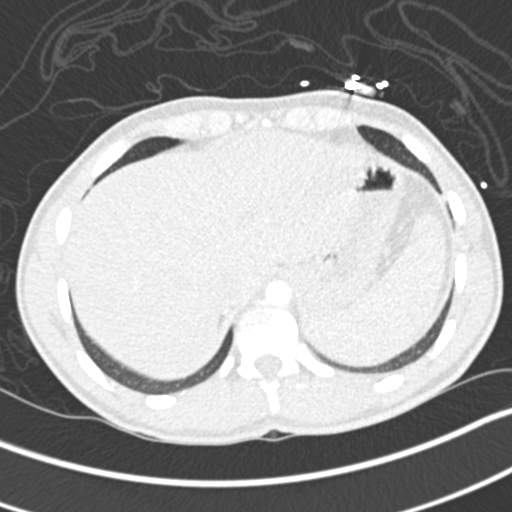
[im 60/267  mediastinal]
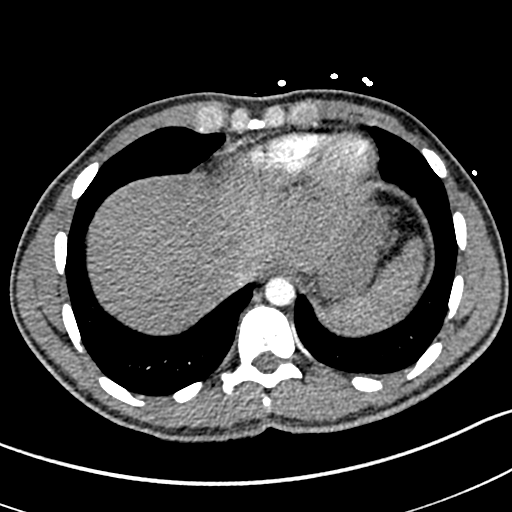
[im 74/267  lung]
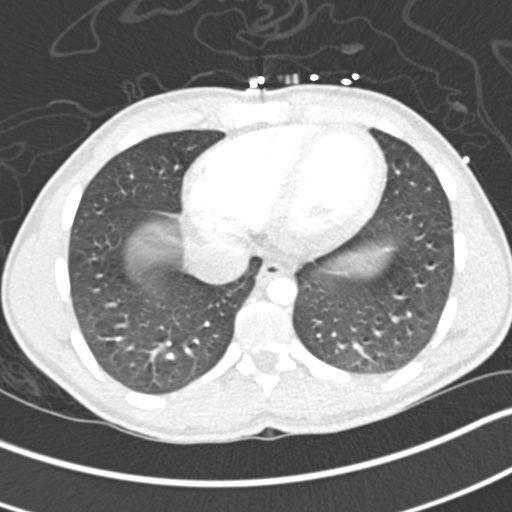
[im 89/267  mediastinal]
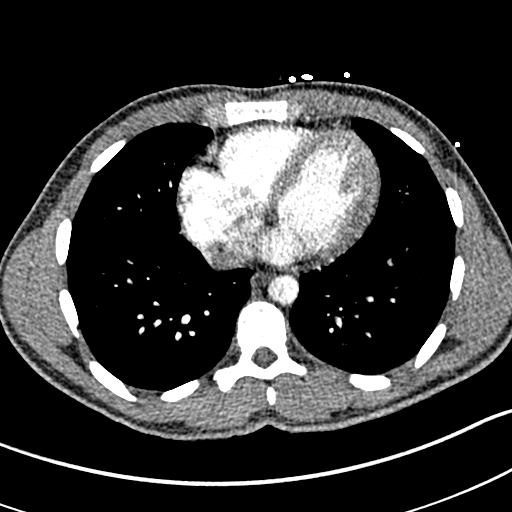
[im 104/267  lung]
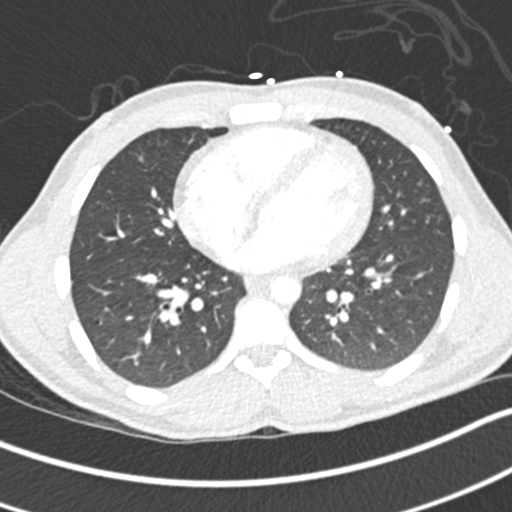
[im 119/267  mediastinal]
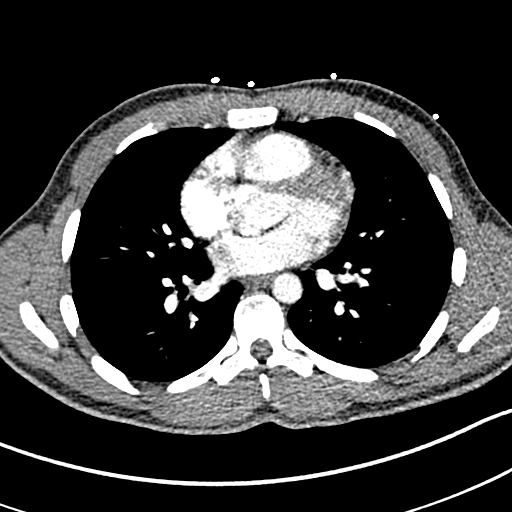
[im 134/267  lung]
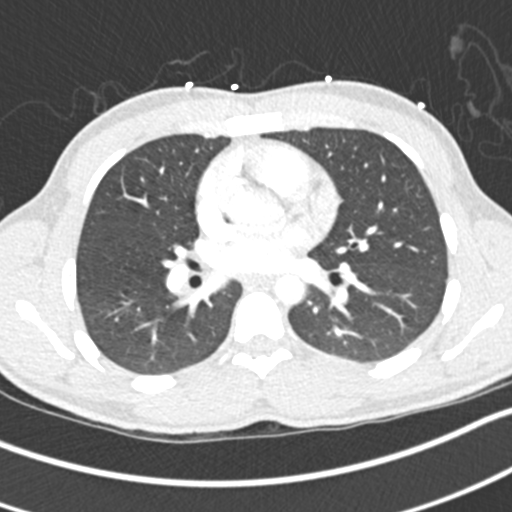
[im 148/267  mediastinal]
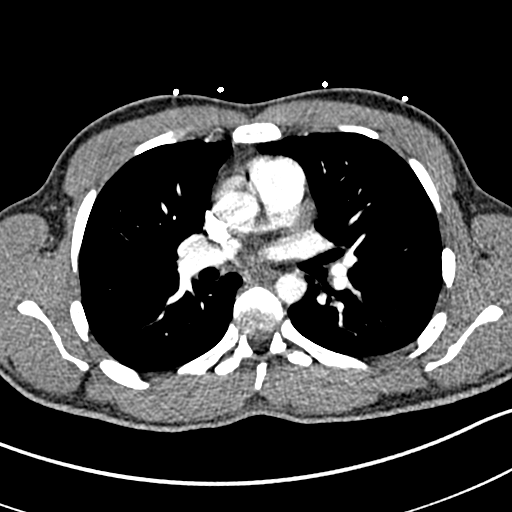
[im 163/267  lung]
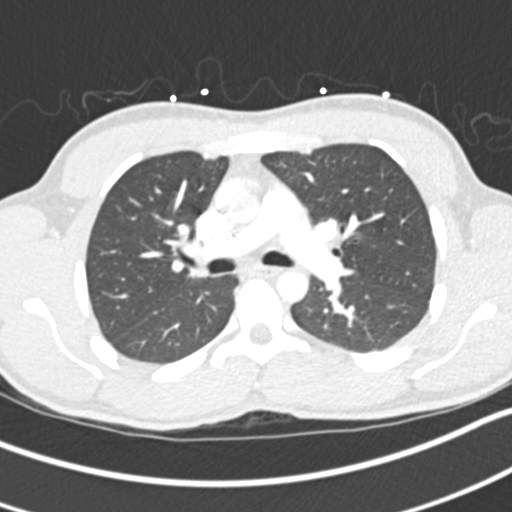
[im 178/267  mediastinal]
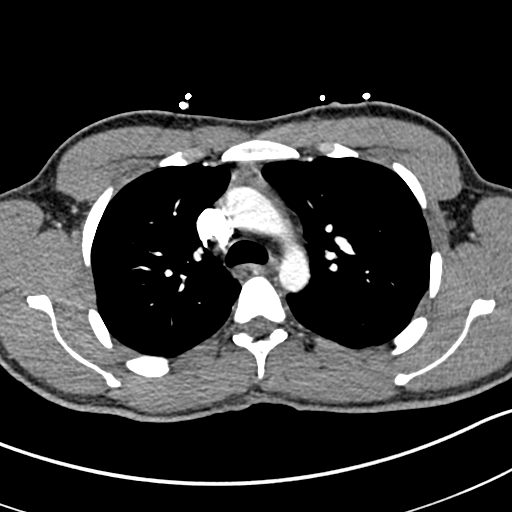
[im 193/267  lung]
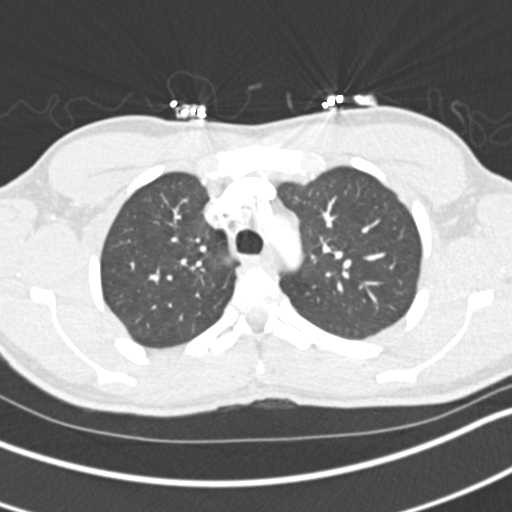
[im 207/267  mediastinal]
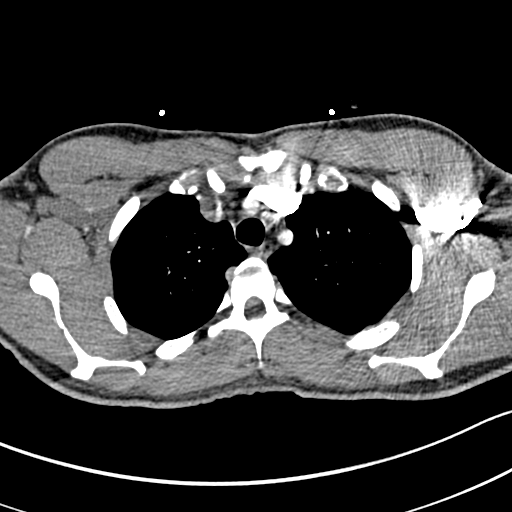
[im 222/267  lung]
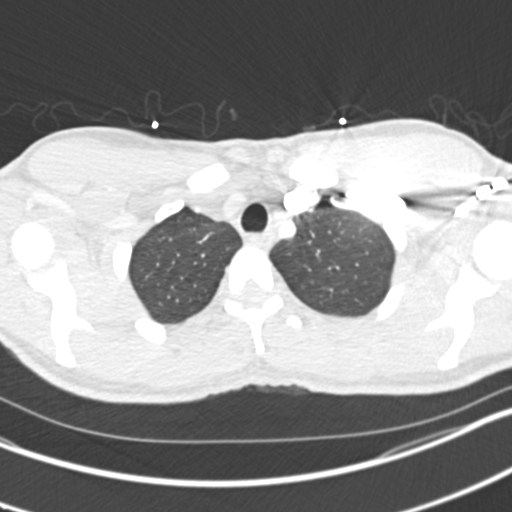
[im 237/267  mediastinal]
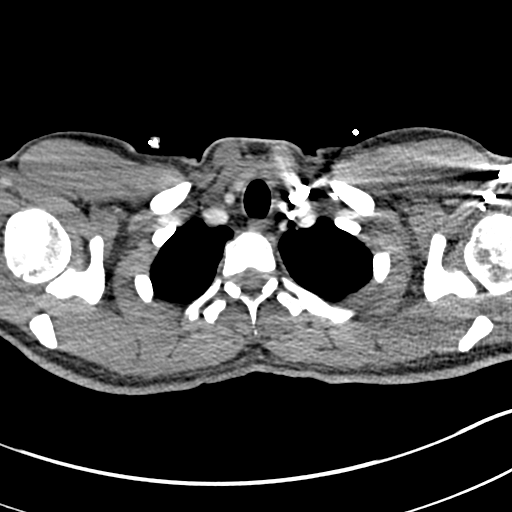
[im 252/267  lung]
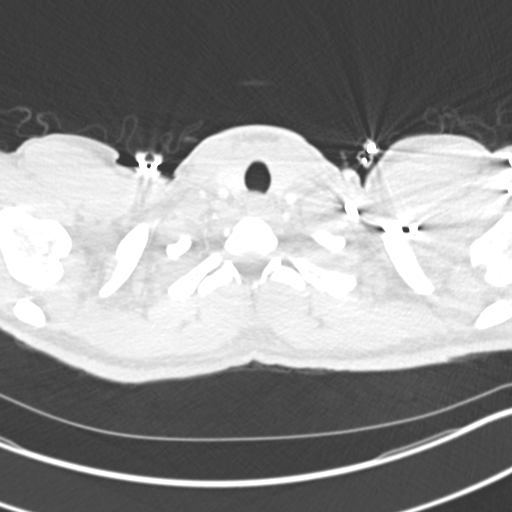

[Series 8: coronal mpr · coronal · 0.62mm/px · 1 of 116 slices shown]
[im 58/116  mediastinal]
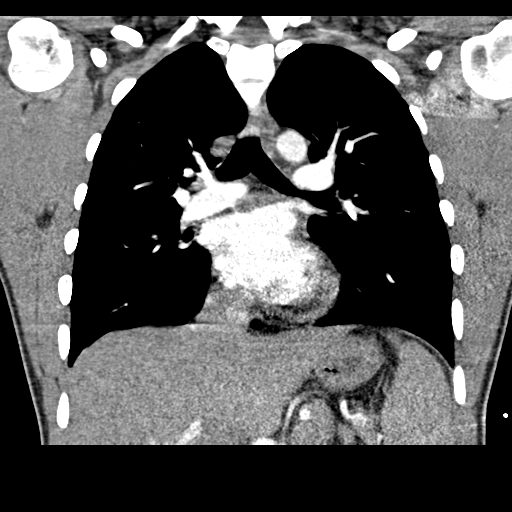

[18 of 36 positions shown; findings below may reference images not displayed]

FINDINGS: Cardiovascular: Satisfactory opacification of the pulmonary arteries
to the segmental level. No evidence of pulmonary embolism. Normal
heart size. No pericardial effusion.

Mediastinum/Nodes: No enlarged mediastinal, hilar, or axillary lymph
nodes. Thyroid gland, trachea, and esophagus demonstrate no
significant findings.

Lungs/Pleura: Lungs are clear. No pleural effusion or pneumothorax.

Upper Abdomen: No acute abnormality.

Musculoskeletal: No chest wall abnormality. No acute or significant
osseous findings.

Review of the MIP images confirms the above findings.
IMPRESSION: Negative for acute pulmonary embolus, pneumonia or other acute
cardiopulmonary process.
